# Patient Record
Sex: Male | Born: 1960 | Race: White | Hispanic: No | Smoking: Current every day smoker
Health system: Southern US, Community
[De-identification: ages and names within clinical notes are randomized; demographics above are authoritative.]

## PROBLEM LIST (undated history)

## (undated) DIAGNOSIS — J449 Chronic obstructive pulmonary disease, unspecified: Secondary | ICD-10-CM

## (undated) DIAGNOSIS — I739 Peripheral vascular disease, unspecified: Secondary | ICD-10-CM

## (undated) HISTORY — PX: BELOW KNEE LEG AMPUTATION: SUR23

---

## 2004-12-17 ENCOUNTER — Ambulatory Visit: Payer: Self-pay | Admitting: Anesthesiology

## 2007-01-06 ENCOUNTER — Other Ambulatory Visit: Payer: Self-pay

## 2007-01-07 ENCOUNTER — Inpatient Hospital Stay: Payer: Self-pay | Admitting: Vascular Surgery

## 2007-08-09 ENCOUNTER — Emergency Department: Payer: Self-pay | Admitting: Emergency Medicine

## 2008-03-24 ENCOUNTER — Emergency Department: Payer: Self-pay | Admitting: Emergency Medicine

## 2008-06-13 ENCOUNTER — Emergency Department: Payer: Self-pay | Admitting: Internal Medicine

## 2008-09-26 ENCOUNTER — Ambulatory Visit: Payer: Self-pay | Admitting: Pain Medicine

## 2013-10-23 ENCOUNTER — Inpatient Hospital Stay: Payer: Self-pay | Admitting: Internal Medicine

## 2013-10-23 LAB — CBC
HGB: 15.4 g/dL (ref 13.0–18.0)
MCHC: 33.8 g/dL (ref 32.0–36.0)
MCV: 97 fL (ref 80–100)
Platelet: 174 10*3/uL (ref 150–440)
RDW: 13.4 % (ref 11.5–14.5)

## 2013-10-23 LAB — BASIC METABOLIC PANEL
Chloride: 102 mmol/L (ref 98–107)
Co2: 31 mmol/L (ref 21–32)
Creatinine: 0.68 mg/dL (ref 0.60–1.30)
EGFR (African American): >60
Glucose: 106 mg/dL — ABNORMAL HIGH (ref 65–99)
Osmolality: 275 (ref 275–301)
Potassium: 3.9 mmol/L (ref 3.5–5.1)
Sodium: 137 mmol/L (ref 136–145)

## 2013-10-27 LAB — CREATININE, SERUM
Creatinine: 0.79 mg/dL (ref 0.60–1.30)
EGFR (African American): >60

## 2013-11-28 ENCOUNTER — Emergency Department: Payer: Self-pay | Admitting: Internal Medicine

## 2013-11-28 LAB — COMPREHENSIVE METABOLIC PANEL
ALBUMIN: 3.5 g/dL (ref 3.4–5.0)
ANION GAP: 4 — AB (ref 7–16)
Alkaline Phosphatase: 104 U/L
BUN: 20 mg/dL — ABNORMAL HIGH (ref 7–18)
Bilirubin,Total: 0.6 mg/dL (ref 0.2–1.0)
CALCIUM: 8.5 mg/dL (ref 8.5–10.1)
Chloride: 104 mmol/L (ref 98–107)
Co2: 26 mmol/L (ref 21–32)
Creatinine: 0.71 mg/dL (ref 0.60–1.30)
EGFR (African American): >60
EGFR (Non-African Amer.): >60
GLUCOSE: 116 mg/dL — AB (ref 65–99)
Osmolality: 272 (ref 275–301)
Potassium: 4.2 mmol/L (ref 3.5–5.1)
SGOT(AST): 30 U/L (ref 15–37)
SGPT (ALT): 23 U/L (ref 12–78)
Sodium: 134 mmol/L — ABNORMAL LOW (ref 136–145)
TOTAL PROTEIN: 6.9 g/dL (ref 6.4–8.2)

## 2013-11-28 LAB — URINALYSIS, COMPLETE
Bacteria: NONE SEEN
Blood: NEGATIVE
GLUCOSE, UR: NEGATIVE mg/dL (ref 0–75)
Leukocyte Esterase: NEGATIVE
Nitrite: NEGATIVE
PH: 5 (ref 4.5–8.0)
PROTEIN: NEGATIVE
RBC,UR: 1 /HPF (ref 0–5)
SPECIFIC GRAVITY: 1.027 (ref 1.003–1.030)
WBC UR: 2 /HPF (ref 0–5)

## 2013-11-28 LAB — CBC WITH DIFFERENTIAL/PLATELET
Basophil #: 0 10*3/uL (ref 0.0–0.1)
Basophil %: 0.3 %
Eosinophil #: 0 10*3/uL (ref 0.0–0.7)
Eosinophil %: 0.4 %
HCT: 49.8 % (ref 40.0–52.0)
HGB: 16.6 g/dL (ref 13.0–18.0)
LYMPHS PCT: 2.6 %
Lymphocyte #: 0.3 10*3/uL — ABNORMAL LOW (ref 1.0–3.6)
MCH: 32 pg (ref 26.0–34.0)
MCHC: 33.3 g/dL (ref 32.0–36.0)
MCV: 96 fL (ref 80–100)
MONOS PCT: 3.5 %
Monocyte #: 0.4 x10 3/mm (ref 0.2–1.0)
NEUTROS ABS: 11.2 10*3/uL — AB (ref 1.4–6.5)
Neutrophil %: 93.2 %
PLATELETS: 180 10*3/uL (ref 150–440)
RBC: 5.18 10*6/uL (ref 4.40–5.90)
RDW: 14.1 % (ref 11.5–14.5)
WBC: 12 10*3/uL — AB (ref 3.8–10.6)

## 2013-11-28 LAB — LIPASE, BLOOD: Lipase: 69 U/L — ABNORMAL LOW (ref 73–393)

## 2013-11-28 LAB — TROPONIN I

## 2014-09-20 IMAGING — CR DG ABDOMEN 3V
1 series · 3 of 3 positions shown · non-contrast
Comparison: 10/26/2013

CLINICAL DATA: Nausea and vomiting

EXAM:
ACUTE ABDOMEN SERIES (2 VIEW ABDOMEN AND 1 VIEW CHEST)

[Series 1: w chest pa · 0.14mm/px · 3 of 3 slices shown]
[im 1/3]
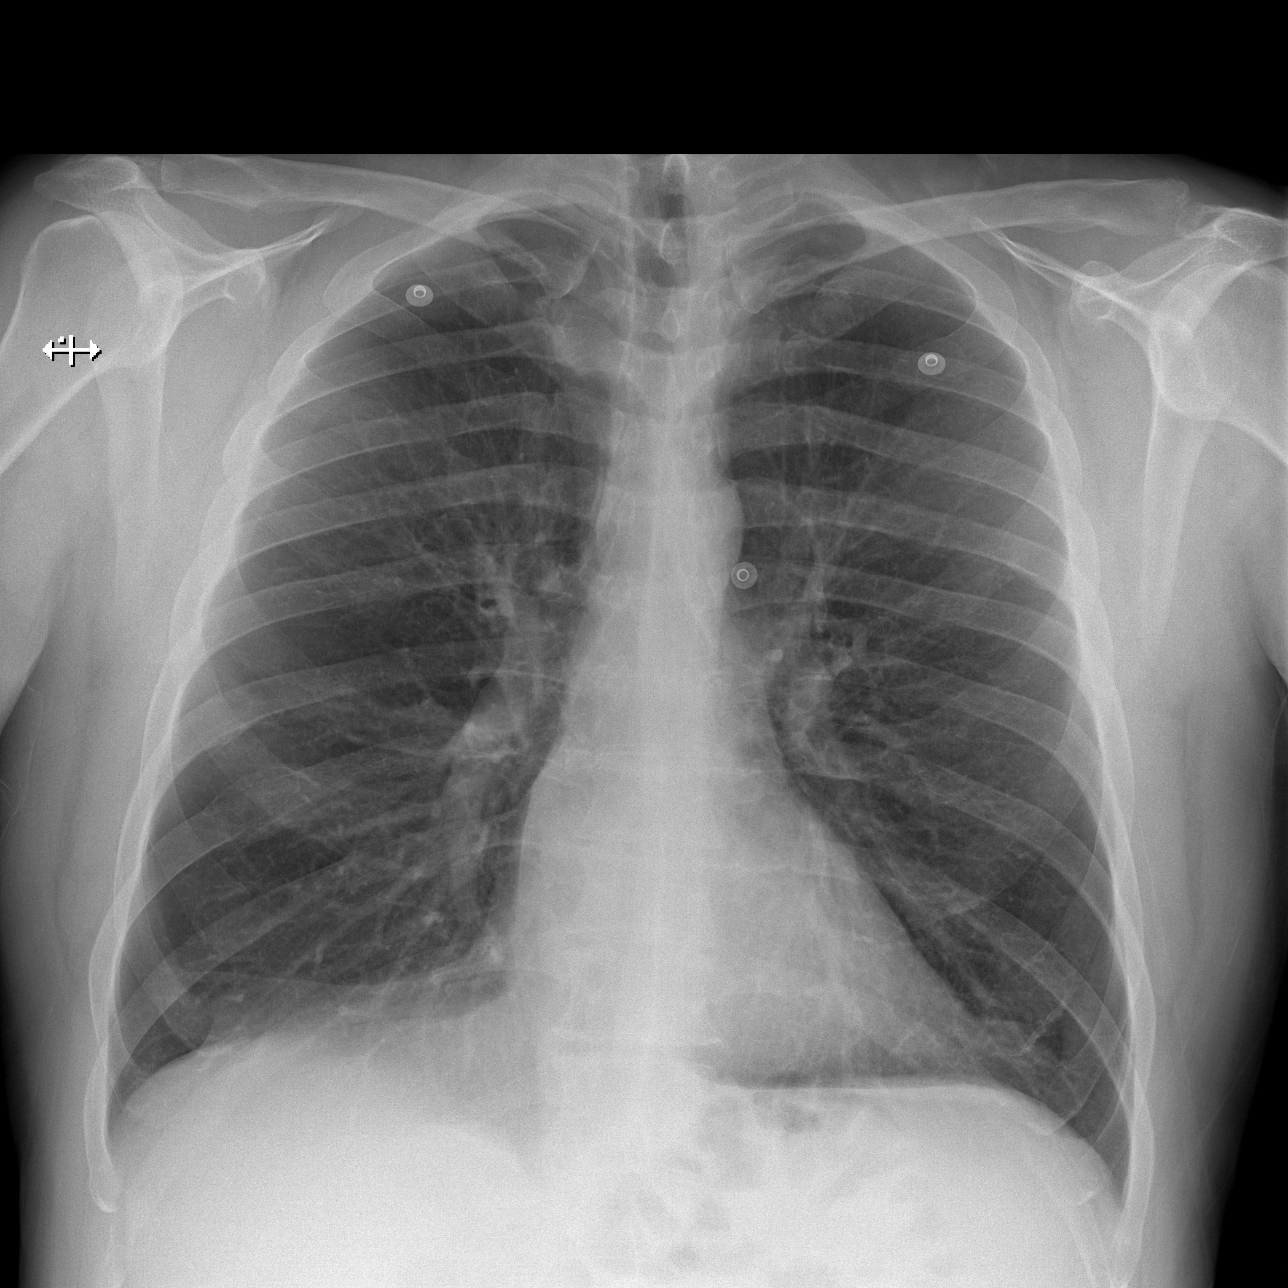
[im 2/3]
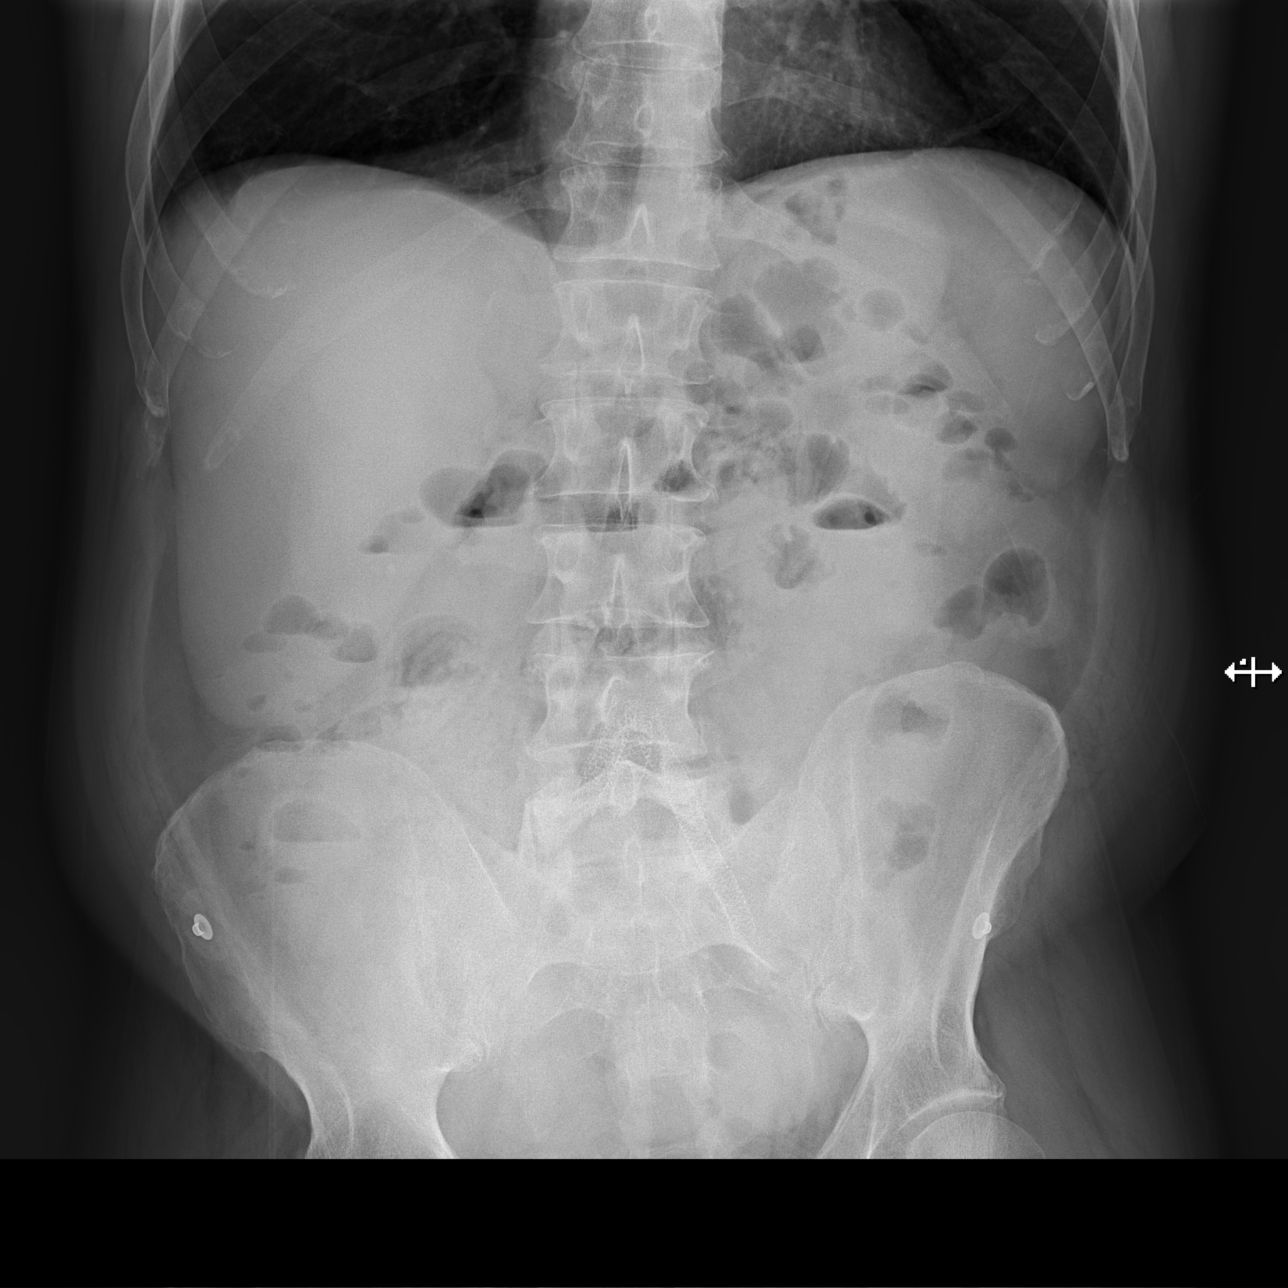
[im 3/3]
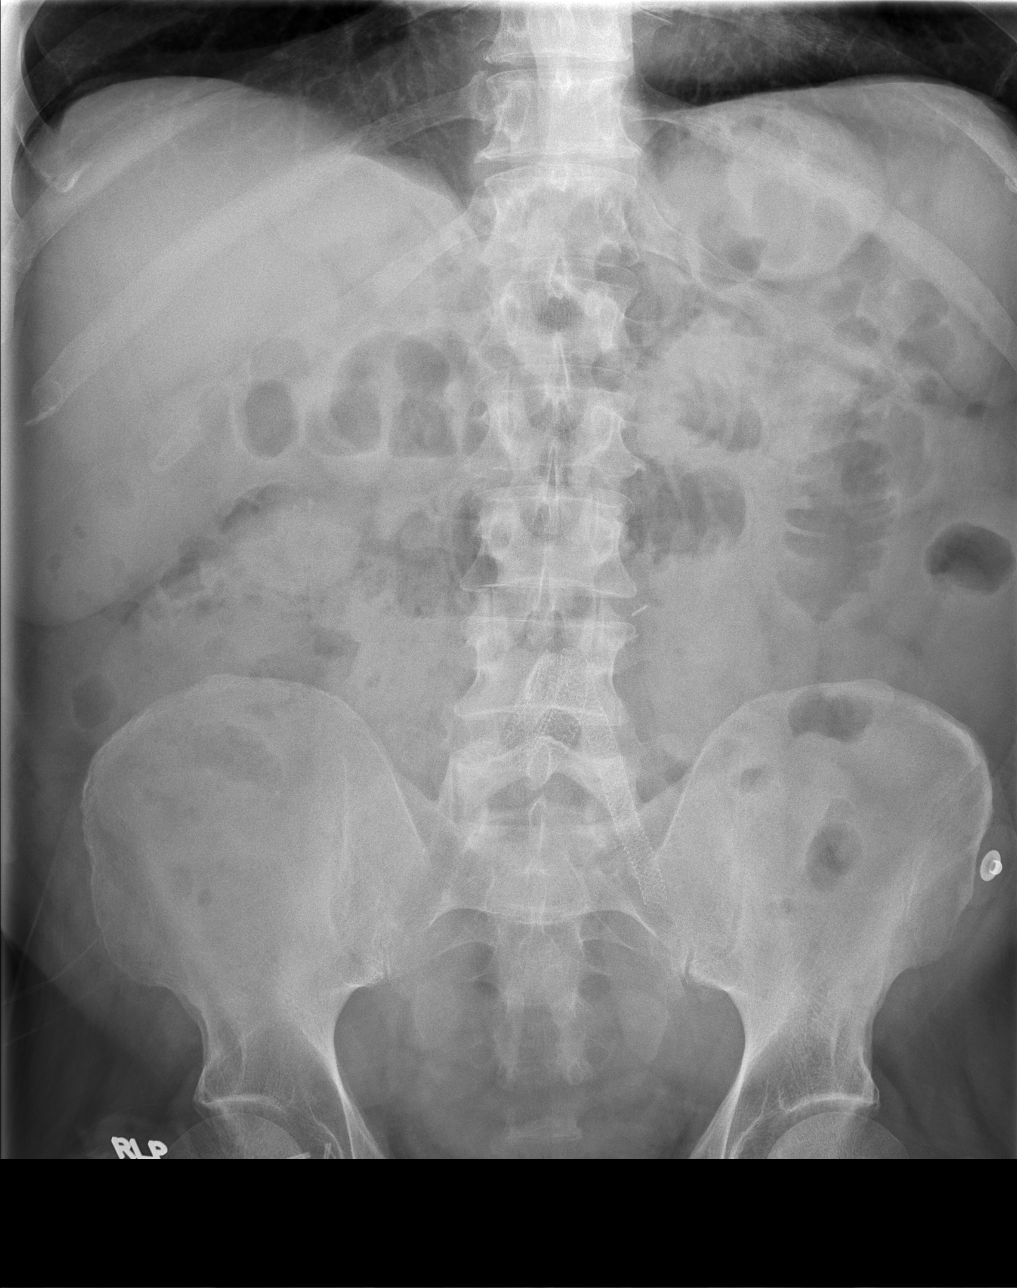

[3 of 3 positions shown; findings below may reference images not displayed]

FINDINGS: Normal heart size.  Hyperinflated lungs.  Clear.

No free intraperitoneal gas. Scattered air-fluid levels. No
disproportionate dilatation of bowel. Tiny calcifications project
over the right liver shadow. Stents project over the iliac vessels.
Nonspecific calcific densities project over the right renal shadow.
IMPRESSION: Nonobstructive bowel gas pattern.

Possible right nephrolithiasis.

## 2015-03-09 NOTE — H&P (Signed)
PATIENT NAME:  Oscar Craig, Oscar Craig MR#:  [id number] DATE OF BIRTH:  Feb 23, 1961  DATE OF ADMISSION:  10/23/2013  REFERRING PHYSICIAN: Physician assistant Reita May  PRIMARY CARE PHYSICIAN: Dr. Lacie Scotts.   CHIEF COMPLAINT: Shortness of breath.  HISTORY OF PRESENT ILLNESS: This 54 year old Caucasian gentleman with history of peripheral vascular disease, status post right below-knee amputation, as well as tobacco abuse, is presenting with shortness of breath. He describes a four-day duration of progressively worsening shortness of breath, described as dyspnea on exertion, as well as prominent nocturnal symptoms with associated cough productive of greenish sputum. He, however, denies any chest pain, palpitations, fevers, chills, orthopnea or edema. He does have positive sick contacts at the homeless shelter where he resides. He is also complaining of right thumb and index finger pain, which is nonradiating, encompasses the entire digit, described as a burning pain with numbness sensation, 3 to 6 out of 10 in intensity, worsened with activity. No relieving factors. In the Emergency Department, on room air, he became hypoxemic, with oxygen saturation dropping into the mid 80s. Currently, he is without complaints.   REVIEW OF SYSTEMS: CONSTITUTIONAL:  Denies fever, fatigue, weakness.  EYES: Denies blurred vision, double vision, eye pain.  EARS, NOSE, THROAT: Denies tinnitus, ear pain, hearing loss.  RESPIRATORY: Positive cough, shortness of breath as described above.  CARDIOVASCULAR: Denies chest pain, palpitations, edema.  GASTROINTESTINAL: Denies nausea, vomiting, diarrhea, abdominal pain.  GENITOURINARY: Denies dysuria, hematuria.  ENDOCRINE: Denies nocturia or polyuria.  HEMATOLOGIC: Denies easy bruising or bleeding.  SKIN: Denies any rash or lesions.  MUSCULOSKELETAL: Denies any pain in neck, back, shoulder, knees, hips, arthritic symptoms.  NEUROLOGIC: Positive for numbness, right thumb and index  finger as described above. Denies any paralysis.  PSYCHIATRIC: Denies anxiety or depressive symptoms.  Otherwise, full review of systems performed by me is negative.   PAST MEDICAL HISTORY: Of tobacco abuse, peripheral vascular disease, right below-knee amputation.   SOCIAL HISTORY: Currently resides at a homeless shelter. Every day tobacco use, less than 1 pack daily. He states that he has cut down to this. Denies any alcohol or drug usage.   FAMILY HISTORY: Denies any knowledge of pulmonary or cardiovascular disease.   ALLERGIES: ASPIRIN.   HOME MEDICATIONS: Include aspirin 81 mg p.o. daily, Plavix 75 mg p.o. daily, acetaminophen/oxycodone 325/5 mg 1 tablet q.4h. as needed for pain.   PHYSICAL EXAMINATION: VITAL SIGNS: Heart rate 92, respirations 24, blood pressure 156/80, saturating 85% on room air. Currently 94% on 3 liters nasal cannula. Weight 81.6 kg, BMI 29.1.  GENERAL: Well-nourished, well-developed, Caucasian gentleman who is currently in minimal distress secondary to respiratory status.  HEAD: Normocephalic, atraumatic.  EYES: Pupils equal, round, reactive to light. Extraocular muscles are intact. No scleral icterus.  MOUTH: Moist mucosal membranes. Dentition intact. No abscess noted.  EARS, NOSE, AND THROAT: Throat clear without exudates. No external lesions.  NECK: Supple. No thyromegaly. No nodules. No JVD.  PULMONARY: Greatly diminished breath sounds, but no wheezes, rubs or rhonchi. No use of accessory muscles. Good respiratory effort.  He is tachypneic.  CHEST: Nontender to palpation.  CARDIOVASCULAR: S1, S2, regular rate and rhythm. No murmurs, rubs, or gallops. No edema. Pedal pulses 2+ left lower extremity.  GASTROINTESTINAL: Soft, nontender, nondistended. No masses. Positive bowel sounds. No hepatosplenomegaly.  MUSCULOSKELETAL: No swelling, clubbing or edema. Range of motion full in all extremities. He has a right below-knee amputation. His right hand has good  capillary refill in all digits. Radial pulse 2+ bilaterally; however,  he does have positive Tinel's sign on the right hand.  NEUROLOGIC: Cranial nerves II through XII intact. No gross focal neurological deficits. Sensation and reflexes intact.  SKIN: No ulcerations, lesions, rashes, cyanosis. Skin warm, dry. Turgor is intact.  PSYCHIATRIC: Mood and affect within normal limits. The patient awake, alert, oriented x3. Insight and judgment intact.   LABORATORY DATA: Chest x-ray performed revealing no acute cardiopulmonary process. EKG performed with normal sinus rhythm, heart rate 93. No ST or T wave abnormalities. Sodium 137, potassium 3.9, chloride 102, bicarbonate 31, BUN 15, creatinine 0.68, glucose 106, troponin I less than 0.02. WBC 7.7, hemoglobin 15.4, platelets 174.   ASSESSMENT AND PLAN: A 54 year old Caucasian gentleman with history of peripheral vascular disease, as well as tobacco abuse, presenting with shortness of breath and cough. 1.  Chronic obstructive pulmonary disease exacerbation. Supplemental O2 to keep oxygen saturation greater than 92%. DuoNeb therapy q.4 hours, flutter valve therapy, Solu-Medrol 60 mg IV daily, azithromycin 500 mg daily. He should have outpatient PFTs for formal chronic obstructive pulmonary disease diagnosis, as this has not been done. 2.  Right index finger and thumb pain, likely related to carpal tunnel syndrome. Pain control. Wrist brace. 3.  Peripheral vascular disease. Continue aspirin, Plavix.  4.  Deep venous thrombosis prophylaxis with heparin subcutaneous.   CODE STATUS: The patient is full code.   TIME SPENT: 45 minutes.    ____________________________ Cletis Athensavid K. Hower, MD dkh:cg D: 10/23/2013 23:05:00 ET T: 10/23/2013 23:37:06 ET JOB#: [id number]  cc: Cletis Athensavid K. Hower, MD, <Dictator> DAVID Synetta ShadowK HOWER MD ELECTRONICALLY SIGNED 10/24/2013 20:32

## 2015-03-09 NOTE — Discharge Summary (Signed)
PATIENT NAME:  Oscar CageBRADLEY, Cayce K MR#:  [id number] DATE OF BIRTH:  [birth year]  DATE OF ADMISSION:  10/23/2013 DATE OF DISCHARGE:  10/28/2013  DISCHARGE DIAGNOSES: 1.  Chronic obstructive pulmonary disease exacerbation.  2.  Acute respiratory failure.  3.  Possible carpal tunnel syndrome.  4.  Tobacco abuse.  5.  Chronic pain syndrome.   IMAGING STUDIES DONE:  Include a chest x-ray which showed no pulmonary edema, effusions or pneumonia.   ADMITTING HISTORY AND PHYSICAL AND HOSPITAL COURSE:  Please see detailed H and P dictated previously.  In brief, this is a 54 year old male patient with history of COPD on oxygen in the past who continues to smoke, presented to the hospital complaining of shortness of breath, cough. The patient was admitted for acute respiratory failure along with COPD exacerbation, started on IV steroids, nebulizers, antibiotics with which he has improved well. The patient sats about 88% to 90% on room air on rest and exertion which is likely his baseline. He was on oxygen in the past, but has not been able to use it as he lives in a homeless shelter.   Today on examination, the patient has mild wheezing which is his baseline. I requested that he quit smoking and patient mentioned that he will try. The patient also has possible carpal tunnel syndrome for which he will see a hand surgeon, needs to be referred by his primary care physician. I started him on Neurontin. He will continue his Percocet from home.   DISCHARGE MEDICATIONS: Include:  1.  Plavix 75 mg daily.  2.  Aspirin 81 mg daily.  3.  Tramadol 50 mg oral every 4 hours as needed for pain.  4.  Vitamin D3 5000 international units oral once a day.  5.  Spiriva 18 mcg daily. 6.  Aceteminophen/ oxycodone 325/5, 1 tablet every 6 hours.  7.  Gabapentin 300 mg oral 2 capsules 2 times a day.  8.  Lasix 20 mg oral once a day.  9.  Fluticasone/salmeterol 250/50 one puff inhaled 2 times a day.  10.  DuoNeb 3 mg inhaled 4  times a day as needed for shortness of breath. 11.  Prednisone 60 mg tapered over 12 days.   DISCHARGE INSTRUCTIONS: Include: Low-salt diet. Activity as tolerated. Follow up with primary care physician within a week. The patient will need to stay off work till 11/04/2013.   TIME SPENT ON DAY OF DISCHARGE IN DISCHARGE ACTIVITY:  50 minutes   ____________________________ Molinda BailiffSrikar R. Marybell Robards, MD srs:ce D: 10/28/2013 14:26:58 ET T: 10/28/2013 14:52:35 ET JOB#: [id number]  cc: Wardell HeathSrikar R. Cobin Cadavid, MD, <Dictator> Meindert A. Lacie ScottsNiemeyer, MD Orie FishermanSRIKAR R Beverly Suriano MD ELECTRONICALLY SIGNED 11/06/2013 10:37

## 2016-01-22 ENCOUNTER — Emergency Department
Admission: EM | Admit: 2016-01-22 | Discharge: 2016-01-22 | Disposition: A | Discharge status: Home / Self Care | Payer: Medicaid Other | Attending: Emergency Medicine | Admitting: Emergency Medicine

## 2016-01-22 ENCOUNTER — Encounter: Payer: Self-pay | Admitting: Medical Oncology

## 2016-01-22 DIAGNOSIS — Y9289 Other specified places as the place of occurrence of the external cause: Secondary | ICD-10-CM | POA: Insufficient documentation

## 2016-01-22 DIAGNOSIS — Y998 Other external cause status: Secondary | ICD-10-CM | POA: Insufficient documentation

## 2016-01-22 DIAGNOSIS — W06XXXA Fall from bed, initial encounter: Secondary | ICD-10-CM | POA: Diagnosis not present

## 2016-01-22 DIAGNOSIS — K0889 Other specified disorders of teeth and supporting structures: Secondary | ICD-10-CM

## 2016-01-22 DIAGNOSIS — F17200 Nicotine dependence, unspecified, uncomplicated: Secondary | ICD-10-CM | POA: Diagnosis not present

## 2016-01-22 DIAGNOSIS — Y9389 Activity, other specified: Secondary | ICD-10-CM | POA: Diagnosis not present

## 2016-01-22 DIAGNOSIS — S01511A Laceration without foreign body of lip, initial encounter: Secondary | ICD-10-CM | POA: Insufficient documentation

## 2016-01-22 HISTORY — DX: Chronic obstructive pulmonary disease, unspecified: J44.9

## 2016-01-22 HISTORY — DX: Peripheral vascular disease, unspecified: I73.9

## 2016-01-22 MED ORDER — LIDOCAINE VISCOUS 2 % MT SOLN
15.0000 mL | Freq: Once | OROMUCOSAL | Status: AC
  Administered 2016-01-22: 15 mL via OROMUCOSAL
  Filled 2016-01-22: qty 15

## 2016-01-22 NOTE — Discharge Instructions (Signed)
Follow-up with walk-in dental clinic today.

## 2016-01-22 NOTE — ED Notes (Signed)
See triage note   Rolled from bed   Hit table   Front teeth feel loose and small laceration noted

## 2016-01-22 NOTE — ED Notes (Signed)
Pt reports that he rolled off of his bed this am and hit the corner of the side table with his lip and reports his tooth feels loose.

## 2016-01-22 NOTE — ED Provider Notes (Signed)
[facility] Emergency Department Provider Note  ____________________________________________  Time seen: Approximately 8:06 AM  I have reviewed the triage vital signs and the nursing notes.   HISTORY  Chief Complaint Lip Laceration    HPI Oscar Craig is a 55 y.o. male patient has a loose tooth upper lip laceration secondary to falling from the bed and hit his front tooth. Patient requests no dental brace for this tooth. Patient rates his pain discomfort as 7/10. No palliative measures taken for this complaint.   Past Medical History  Diagnosis Date  . PAD (peripheral artery disease) (HCC)   . COPD (chronic obstructive pulmonary disease) (HCC)     There are no active problems to display for this patient.   Past Surgical History  Procedure Laterality Date  . Below knee leg amputation      No current outpatient prescriptions on file.  Allergies Review of patient's allergies indicates no known allergies.  No family history on file.  Social History Social History  Substance Use Topics  . Smoking status: Current Every Day Smoker  . Smokeless tobacco: None  . Alcohol Use: None    Review of Systems Constitutional: No fever/chills Eyes: No visual changes. ENT: No sore throat. Fractured tooth Cardiovascular: Denies chest pain. Respiratory: Denies shortness of breath. Gastrointestinal: No abdominal pain.  No nausea, no vomiting.  No diarrhea.  No constipation. Genitourinary: Negative for dysuria. Musculoskeletal: Negative for back pain. Skin: Negative for rash. Upper lip laceration Neurological: Negative for headaches, focal weakness or numbness.    ____________________________________________   PHYSICAL EXAM:  VITAL SIGNS: ED Triage Vitals  Enc Vitals Group     BP 01/22/16 0739 146/79 mmHg     Pulse Rate 01/22/16 0739 90     Resp 01/22/16 0739 20     Temp 01/22/16 0739 98.1 F (36.7 C)     Temp Source 01/22/16 0739 Oral    SpO2 01/22/16 0739 90 %     Weight 01/22/16 0739 235 lb (106.595 kg)     Height 01/22/16 0739  (1.676 m)     Head Cir --      Peak Flow --      Pain Score 01/22/16 0745 7     Pain Loc --      Pain Edu? --      Excl. in GC? --     Constitutional: Alert and oriented. Well appearing and in no acute distress. Eyes: Conjunctivae are normal. PERRL. EOMI. Head: Atraumatic. Nose: No congestion/rhinnorhea. Mouth/Throat: Mucous membranes are moist.  Oropharynx non-erythematous. Loose tooth #7 Neck: No stridor.  No cervical spine tenderness to palpation. Hematological/Lymphatic/Immunilogical: No cervical lymphadenopathy. Cardiovascular: Normal rate, regular rhythm. Grossly normal heart sounds.  Good peripheral circulation. Respiratory: Normal respiratory effort.  No retractions. Lungs CTAB. Gastrointestinal: Soft and nontender. No distention. No abdominal bruits. No CVA tenderness. Genitourinary:  Musculoskeletal: No lower extremity tenderness nor edema.  No joint effusions. Neurologic:  Normal speech and language. No gross focal neurologic deficits are appreciated. No gait instability. Skin:  Skin is warm, dry and intact. No rash noted. Minor inner upper lip laceration  Psychiatric: Mood and affect are normal. Speech and behavior are normal.  ____________________________________________   LABS (all labs ordered are listed, but only abnormal results are displayed)  Labs Reviewed - No data to display ____________________________________________  EKG   ____________________________________________  RADIOLOGY   ____________________________________________   PROCEDURES  Procedure(s) performed: None  Critical Care performed: No  ____________________________________________   INITIAL  IMPRESSION / ASSESSMENT AND PLAN / ED COURSE  Pertinent labs & imaging results that were available during my care of the patient were reviewed by me and considered in my medical decision  making (see chart for details).  Right upper inner lip laceration Are no suture required. Loose tooth. [facility]-in basis today. ____________________________________________   FINAL CLINICAL IMPRESSION(S) / ED DIAGNOSES  Final diagnoses:  Loose tooth due to trauma      Joni Reiningonald K Jennene Downie, PA-C 01/22/16 [id number]  Emily FilbertJonathan E Williams, MD 01/22/16 [phone]

## 2016-02-16 DEATH — deceased
